# Patient Record
Sex: Female | Born: 1950 | Race: White | Hispanic: No
Health system: Southern US, Community
[De-identification: ages and names within clinical notes are randomized; demographics above are authoritative.]

---

## 2005-05-17 ENCOUNTER — Emergency Department: Payer: Self-pay | Admitting: General Practice
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
## 2017-08-10 ENCOUNTER — Telehealth: Payer: Self-pay | Admitting: *Deleted

## 2017-08-10 NOTE — Telephone Encounter (Signed)
ERROR
